# Patient Record
Sex: Male | Born: 1975 | Race: Black or African American | Hispanic: No | Marital: Single | Smoking: Current every day smoker
Health system: Southern US, Community
[De-identification: ages and names within clinical notes are randomized; demographics above are authoritative.]

## PROBLEM LIST (undated history)

## (undated) HISTORY — PX: KNEE SURGERY: SHX244

---

## 2019-05-17 ENCOUNTER — Emergency Department (HOSPITAL_BASED_OUTPATIENT_CLINIC_OR_DEPARTMENT_OTHER)
Admission: EM | Admit: 2019-05-17 | Discharge: 2019-05-17 | Disposition: A | Discharge status: Home / Self Care | Payer: Self-pay | Attending: Emergency Medicine | Admitting: Emergency Medicine

## 2019-05-17 ENCOUNTER — Other Ambulatory Visit: Payer: Self-pay

## 2019-05-17 ENCOUNTER — Emergency Department (HOSPITAL_BASED_OUTPATIENT_CLINIC_OR_DEPARTMENT_OTHER): Payer: Self-pay

## 2019-05-17 ENCOUNTER — Encounter (HOSPITAL_BASED_OUTPATIENT_CLINIC_OR_DEPARTMENT_OTHER): Payer: Self-pay | Admitting: *Deleted

## 2019-05-17 DIAGNOSIS — F17210 Nicotine dependence, cigarettes, uncomplicated: Secondary | ICD-10-CM | POA: Insufficient documentation

## 2019-05-17 DIAGNOSIS — M25562 Pain in left knee: Secondary | ICD-10-CM | POA: Insufficient documentation

## 2019-05-17 MED ORDER — ACETAMINOPHEN 500 MG PO TABS
1000.0000 mg | ORAL_TABLET | Freq: Once | ORAL | Status: AC
  Administered 2019-05-17: 14:00:00 1000 mg via ORAL
  Filled 2019-05-17: qty 2

## 2019-05-17 NOTE — ED Notes (Signed)
Pt refused knee immobilizer and crutches.  He states he has some at home.

## 2019-05-17 NOTE — ED Triage Notes (Signed)
He stepped into a hole. Injury to his left knee. Swelling, pain and abrasion to his left knee. He is ambulatory.

## 2019-05-17 NOTE — ED Notes (Signed)
Per Ezzard Flax RN. Patient said that he had an immobilizer and crutches at home.

## 2019-05-17 NOTE — ED Provider Notes (Signed)
[facility] EMERGENCY DEPARTMENT Provider Note   CSN: [id number] Arrival date & time: 05/17/19  1235     History   Chief Complaint Chief Complaint  Patient presents with  . Knee Injury    HPI Ray Terrell is a 43 y.o. male who presents for evaluation of left knee pain after mechanical fall.  He reports that about 11 AM this morning, he was helping to move a mattress and states that he fell in a hole that went up to his knee.  He states that his knee scraped the side of all.  He does not think it twisted at all.  He states initially after incident, he was having difficulty ambulating bearing weight.  Since then, he has been able to take a few steps in the knee.  He denies any numbness/weakness.  Denies any hip pain, lower leg or ankle pain.  He has not taken anything for pain.     The history is provided by the patient.    History reviewed. No pertinent past medical history.  There are no active problems to display for this patient.   Past Surgical History:  Procedure Laterality Date  . KNEE SURGERY          Home Medications    Prior to Admission medications   Not on File    Family History No family history on file.  Social History Social History   Tobacco Use  . Smoking status: Current Every Day Smoker  . Smokeless tobacco: Never Used  Substance Use Topics  . Alcohol use: Not Currently  . Drug use: Never     Allergies   Patient has no known allergies.   Review of Systems Review of Systems  Musculoskeletal:       Left knee pain  Neurological: Negative for weakness and numbness.  All other systems reviewed and are negative.    Physical Exam Updated Vital Signs BP 119/68   Pulse 60   Temp 98.3 F (36.8 C) (Oral)   Resp 20   Ht 5\' 11"  (1.803 m)   Wt 81.6 kg   SpO2 100%   BMI 25.10 kg/m   Physical Exam Vitals signs and nursing note reviewed.  Constitutional:      Appearance: He is well-developed.  HENT:     Head:  Normocephalic and atraumatic.  Eyes:     General: No scleral icterus.       Right eye: No discharge.        Left eye: No discharge.     Conjunctiva/sclera: Conjunctivae normal.  Cardiovascular:     Pulses:          Dorsalis pedis pulses are 2+ on the left side.  Pulmonary:     Effort: Pulmonary effort is normal.  Musculoskeletal:     Comments: Tenderness palpation noted medial aspect of left knee.  There is some mild overlying soft tissue swelling.  No deformity or crepitus noted.  No ecchymosis.  Flexion/extension intact but with some subjective reports of pain.  Negative posterior and anterior drawer test.  Pain with varus and valgus stress but no instability noted.  He has a small abrasion noted on the anterior medial aspect of his left knee.  No laceration.  No tenderness palpation noted to femur, hip.  No tenderness palpation noted proximal, distal tib-fib, ankle.  He can wiggle all 5 toes without any difficulty.  Skin:    General: Skin is warm and dry.     Capillary Refill:  Capillary refill takes less than 2 seconds.     Comments: Good distal cap refill.  RLE is not dusky in appearance or cool to touch.  Neurological:     Mental Status: He is alert.     Comments: Sensation intact along major nerve distributions of BLE  Psychiatric:        Speech: Speech normal.        Behavior: Behavior normal.      ED Treatments / Results  Labs (all labs ordered are listed, but only abnormal results are displayed) Labs Reviewed - No data to display  EKG None  Radiology Dg Knee Complete 4 Views Left  Result Date: 05/17/2019 CLINICAL DATA:  Injury. EXAM: LEFT KNEE - COMPLETE 4+ VIEW COMPARISON:  No recent. FINDINGS: Severe tricompartment degenerative change noted. Multiple loose bodies. No fracture or dislocation knee joint effusion cannot be excluded. IMPRESSION: 1. 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 No acute bony abnormality identified. 2.  Knee joint  effusion cannot be excluded. Electronically Signed   By: Marcello Moores  Register   On: 05/17/2019 13:08    Procedures Procedures (including critical care time)  Medications Ordered in ED Medications  acetaminophen (TYLENOL) tablet 1,000 mg (1,000 mg Oral Given 05/17/19 1358)     Initial Impression / Assessment and Plan / ED Course  I have reviewed the triage vital signs and the nursing notes.  Pertinent labs & imaging results that were available during my care of the patient were reviewed by me and considered in my medical decision making (see chart for details).        43 year old male who presents for evaluation of left knee pain after mechanical fall that occurred about 11 AM this morning.  Reports falling at home.  Has been able to take a few steps and ambulate. Patient is afebrile, non-toxic appearing, sitting comfortably on examination table. Vital signs reviewed and stable. Patient is neurovascularly intact.  On exam, he has tenderness palpation of the medial anterior aspect of left knee with some overlying soft tissue swelling.  No deformity or crepitus noted.  Concern for knee sprain versus fracture versus dislocation.  X-rays ordered at triage.  X-rays reviewed.  There is no acute fracture or dislocation noted.  Knee joint effusion cannot be excluded.  He also has severe tricompartment degenerative changes with multiple loose bodies.  He does have some mild swelling noted on the medial aspect.  I suspect this is most likely from traumatic injury.  No indication for arthrocentesis at this time.  He is exam not comfortable concerning for septic arthritis.  We will plan to treat as knee sprain with knee immobilizer and crutches.  Discussed plan with patient.  We will given outpatient orthopedic follow-up. At this time, patient exhibits no emergent life-threatening condition that require further evaluation in ED or admission. Patient had ample opportunity for questions and discussion. All  patient's questions were answered with full understanding. Strict return precautions discussed. Patient expresses understanding and agreement to plan.   Portions of this note were generated with [employer]. Dictation errors may occur despite best attempts at proofreading.  Final Clinical Impressions(s) / ED Diagnoses   Final diagnoses:  Acute pain of left knee    ED Discharge Orders    None       Desma Mcgregor 05/17/19 2129    Sherwood Gambler, MD 05/18/19 [phone]

## 2019-05-17 NOTE — Discharge Instructions (Signed)
You can take Tylenol or Ibuprofen as directed for pain. You can alternate Tylenol and Ibuprofen every 4 hours. If you take Tylenol at 1pm, then you can take Ibuprofen at 5pm. Then you can take Tylenol again at 9pm.   Follow the RICE (Rest, Ice, Compression, Elevation) protocol as directed.   Wear knee immobilizer for support and stabilization.  Follow-up with referred outpatient orthopedic doctor.  Return to emergency department for any worsening pain, numbness/weakness of your arms or legs or any other worsening or concerning symptoms.

## 2019-08-06 ENCOUNTER — Other Ambulatory Visit: Payer: Self-pay

## 2019-08-06 ENCOUNTER — Encounter (HOSPITAL_BASED_OUTPATIENT_CLINIC_OR_DEPARTMENT_OTHER): Payer: Self-pay | Admitting: *Deleted

## 2019-08-06 ENCOUNTER — Emergency Department (HOSPITAL_BASED_OUTPATIENT_CLINIC_OR_DEPARTMENT_OTHER)
Admission: EM | Admit: 2019-08-06 | Discharge: 2019-08-06 | Disposition: A | Discharge status: Home / Self Care | Payer: Self-pay | Attending: Emergency Medicine | Admitting: Emergency Medicine

## 2019-08-06 DIAGNOSIS — Y998 Other external cause status: Secondary | ICD-10-CM | POA: Insufficient documentation

## 2019-08-06 DIAGNOSIS — F17210 Nicotine dependence, cigarettes, uncomplicated: Secondary | ICD-10-CM | POA: Insufficient documentation

## 2019-08-06 DIAGNOSIS — L739 Follicular disorder, unspecified: Secondary | ICD-10-CM | POA: Insufficient documentation

## 2019-08-06 DIAGNOSIS — Y9289 Other specified places as the place of occurrence of the external cause: Secondary | ICD-10-CM | POA: Insufficient documentation

## 2019-08-06 DIAGNOSIS — Y9389 Activity, other specified: Secondary | ICD-10-CM | POA: Insufficient documentation

## 2019-08-06 DIAGNOSIS — S30862A Insect bite (nonvenomous) of penis, initial encounter: Secondary | ICD-10-CM | POA: Insufficient documentation

## 2019-08-06 DIAGNOSIS — W57XXXA Bitten or stung by nonvenomous insect and other nonvenomous arthropods, initial encounter: Secondary | ICD-10-CM | POA: Insufficient documentation

## 2019-08-06 MED ORDER — SULFAMETHOXAZOLE-TRIMETHOPRIM 800-160 MG PO TABS
1.0000 | ORAL_TABLET | Freq: Once | ORAL | Status: AC
  Administered 2019-08-06: 1 via ORAL
  Filled 2019-08-06: qty 1

## 2019-08-06 MED ORDER — SULFAMETHOXAZOLE-TRIMETHOPRIM 800-160 MG PO TABS: 1.0000 | ORAL_TABLET | Freq: Two times a day (BID) | ORAL | 0 refills | Status: DC

## 2019-08-06 MED FILL — SULFAMETHOXAZOLE-TMP DS TAB: 800-160 | 7 days supply | Qty: 14 | Fill #0

## 2019-08-06 NOTE — ED Triage Notes (Signed)
Spider bite to his penis x 3 days.

## 2019-08-06 NOTE — ED Provider Notes (Signed)
[place] EMERGENCY DEPARTMENT Provider Note   CSN: [id number] Arrival date & time: 08/06/19  1047     History   Chief Complaint Chief Complaint  Patient presents with  . Insect Bite    HPI Ray Terrell is a 43 y.o. male presenting with concern for insect bite to his penis that he noticed 3 days ago.  He has had spider bites in the past and thinks this may be another one, however did not see a particular spider bite him.  Patient states initially began as a raised red bump which he then "messed with."  He states it then opened up and drained some fluid.  It is painful and irritating.  He is currently sexually active with male partners without protection.  He denies history of STD or urinary symptoms.  No fevers.  No interventions tried prior to arrival.      The history is provided by the patient.    History reviewed. No pertinent past medical history.  There are no active problems to display for this patient.   Past Surgical History:  Procedure Laterality Date  . KNEE SURGERY          Home Medications    Prior to Admission medications   Medication Sig Start Date End Date Taking? Authorizing Provider  sulfamethoxazole-trimethoprim (BACTRIM DS) 800-160 MG tablet Take 1 tablet by mouth 2 (two) times daily for 7 days. 08/06/19 08/13/19  Isla Pence, MD    Family History No family history on file.  Social History Social History   Tobacco Use  . Smoking status: Current Every Day Smoker  . Smokeless tobacco: Never Used  Substance Use Topics  . Alcohol use: Not Currently  . Drug use: Never     Allergies   Patient has no known allergies.   Review of Systems Review of Systems  All other systems reviewed and are negative.    Physical Exam Updated Vital Signs BP 132/87   Pulse 60   Temp 98.2 F (36.8 C) (Oral)   Resp 18   Ht 5\' 11"  (1.803 m)   Wt 81.6 kg   SpO2 100%   BMI 25.09 kg/m   Physical Exam Vitals signs and nursing  note reviewed. Exam conducted with a chaperone present.  Constitutional:      Appearance: He is well-developed.  HENT:     Head: Normocephalic and atraumatic.  Eyes:     Conjunctiva/sclera: Conjunctivae normal.  Cardiovascular:     Rate and Rhythm: Normal rate.  Pulmonary:     Effort: Pulmonary effort is normal.  Genitourinary:    Penis: Circumcised.      Comments: There is a firm tender papule with a scabbed center to the right penile shaft.  No crusting or drainage.  No redness.  Patient did not allow me to inspect the remainder of the genitalia. Neurological:     Mental Status: He is alert.  Psychiatric:        Mood and Affect: Mood normal.        Behavior: Behavior normal.      ED Treatments / Results  Labs (all labs ordered are listed, but only abnormal results are displayed) Labs Reviewed - No data to display  EKG None  Radiology No results found.  Procedures Procedures (including critical care time)  Medications Ordered in ED Medications  sulfamethoxazole-trimethoprim (BACTRIM DS) 800-160 MG per tablet 1 tablet (has no administration in time range)     Initial Impression / Assessment  and Plan / ED Course  I have reviewed the triage vital signs and the nursing notes.  Pertinent labs & imaging results that were available during my care of the patient were reviewed by me and considered in my medical decision making (see chart for details).        Patient presenting with rash to his penis that occurred 3 days ago.  He does report unprotected sexual activity with male partners.  On exam there is a tender raised lesion to the penile shaft, not actively draining.  Exam seems less consistent with herpetic lesion or chancre.  Discussed this with patient to provide reassurance, however patient became angry with this provider that I even mentioned an STD.  Assured patient this was purely for reassurance, however was unable to discuss management recommendations for  likely folliculitis, as pt demanded to see another provider. Dr. Particia Nearing evaluated pt and discussed management recommendations. Pt discharged.  Final Clinical Impressions(s) / ED Diagnoses   Final diagnoses:  Insect bite of penis, initial encounter  Folliculitis    ED Discharge Orders         Ordered    sulfamethoxazole-trimethoprim (BACTRIM DS) 800-160 MG tablet  2 times daily     08/06/19 1136           [place], [place], [place] 08/06/19 1141    Jacalyn Lefevre, MD 08/06/19 1409

## 2019-08-10 ENCOUNTER — Encounter (HOSPITAL_BASED_OUTPATIENT_CLINIC_OR_DEPARTMENT_OTHER): Payer: Self-pay | Admitting: Emergency Medicine

## 2019-08-10 ENCOUNTER — Other Ambulatory Visit: Payer: Self-pay

## 2019-08-10 ENCOUNTER — Emergency Department (HOSPITAL_BASED_OUTPATIENT_CLINIC_OR_DEPARTMENT_OTHER)
Admission: EM | Admit: 2019-08-10 | Discharge: 2019-08-10 | Disposition: A | Discharge status: Home / Self Care | Payer: PRIVATE HEALTH INSURANCE | Attending: Emergency Medicine | Admitting: Emergency Medicine

## 2019-08-10 DIAGNOSIS — F17210 Nicotine dependence, cigarettes, uncomplicated: Secondary | ICD-10-CM | POA: Insufficient documentation

## 2019-08-10 DIAGNOSIS — Z202 Contact with and (suspected) exposure to infections with a predominantly sexual mode of transmission: Secondary | ICD-10-CM

## 2019-08-10 LAB — URINALYSIS, ROUTINE W REFLEX MICROSCOPIC
Bilirubin Urine: NEGATIVE
Glucose, UA: NEGATIVE mg/dL
Ketones, ur: NEGATIVE mg/dL
Nitrite: NEGATIVE
Protein, ur: NEGATIVE mg/dL
Specific Gravity, Urine: 1.025 (ref 1.005–1.030)
pH: 6 (ref 5.0–8.0)

## 2019-08-10 LAB — URINALYSIS, MICROSCOPIC (REFLEX)

## 2019-08-10 MED ORDER — CEFTRIAXONE SODIUM 250 MG IJ SOLR
250.0000 mg | Freq: Once | INTRAMUSCULAR | Status: AC
  Administered 2019-08-10: 18:00:00 250 mg via INTRAMUSCULAR
  Filled 2019-08-10: qty 250

## 2019-08-10 MED ORDER — LIDOCAINE HCL (PF) 1 % IJ SOLN
INTRAMUSCULAR | Status: AC
  Administered 2019-08-10: 18:00:00
  Filled 2019-08-10: qty 5

## 2019-08-10 MED ORDER — AZITHROMYCIN 250 MG PO TABS
1000.0000 mg | ORAL_TABLET | Freq: Once | ORAL | Status: AC
  Administered 2019-08-10: 18:00:00 1000 mg via ORAL
  Filled 2019-08-10: qty 4

## 2019-08-10 MED ORDER — METRONIDAZOLE 500 MG PO TABS
2000.0000 mg | ORAL_TABLET | Freq: Once | ORAL | Status: AC
  Administered 2019-08-10: 2000 mg via ORAL
  Filled 2019-08-10: qty 4

## 2019-08-10 NOTE — Discharge Instructions (Signed)
You were evaluated in the emergency department for possible STDs.  You were tested for HIV syphilis gonorrhea chlamydia and trichomonas.  We will contact you if any of your tests are positive.  Please use condoms for safer sex.

## 2019-08-10 NOTE — ED Triage Notes (Signed)
Sexual partner tested positive for Trich.  Pt wants to be tested for STDs. No sx.

## 2019-08-10 NOTE — ED Provider Notes (Signed)
[place] EMERGENCY DEPARTMENT Provider Note   CSN: [id number] Arrival date & time: 08/10/19  1736     History   Chief Complaint Chief Complaint  Patient presents with  . Exposure to STD    HPI Ray Terrell is a 43 y.o. male.  He has no significant past medical history.  He is complaining of his sexual partner being positive for trichomonas.  He denies any symptoms.  He is here asking for testing for STDs and treatment.  No known sores or lesions no discharge.     The history is provided by the patient.  Exposure to STD This is a new problem. The problem has not changed since onset.Pertinent negatives include no abdominal pain and no headaches. Nothing aggravates the symptoms. Nothing relieves the symptoms. He has tried nothing for the symptoms. The treatment provided no relief.    History reviewed. No pertinent past medical history.  There are no active problems to display for this patient.   Past Surgical History:  Procedure Laterality Date  . KNEE SURGERY          Home Medications    Prior to Admission medications   Not on File    Family History No family history on file.  Social History Social History   Tobacco Use  . Smoking status: Current Every Day Smoker    Packs/day: 0.50    Types: Cigarettes  . Smokeless tobacco: Never Used  Substance Use Topics  . Alcohol use: Not Currently  . Drug use: Never     Allergies   Patient has no known allergies.   Review of Systems Review of Systems  Constitutional: Negative for fever.  Gastrointestinal: Negative for abdominal pain.  Genitourinary: Negative for discharge, dysuria, hematuria and testicular pain.  Skin: Negative for rash.  Neurological: Negative for headaches.     Physical Exam Updated Vital Signs BP 139/86 (BP Location: Left Arm)   Pulse 96   Temp 99.2 F (37.3 C) (Oral)   Resp 16   Ht 5\' 11"  (1.803 m)   Wt 78 kg   SpO2 99%   BMI 23.99 kg/m   Physical Exam  Vitals signs and nursing note reviewed.  Constitutional:      Appearance: He is well-developed.  HENT:     Head: Normocephalic and atraumatic.  Eyes:     Conjunctiva/sclera: Conjunctivae normal.  Neck:     Musculoskeletal: Neck supple.  Cardiovascular:     Rate and Rhythm: Normal rate and regular rhythm.  Pulmonary:     Effort: Pulmonary effort is normal.     Breath sounds: Normal breath sounds.  Abdominal:     Tenderness: There is no abdominal tenderness. There is no guarding.  Skin:    General: Skin is warm and dry.     Capillary Refill: Capillary refill takes less than 2 seconds.  Neurological:     General: No focal deficit present.     Mental Status: He is alert.     GCS: GCS eye subscore is 4. GCS verbal subscore is 5. GCS motor subscore is 6.     Gait: Gait normal.      ED Treatments / Results  Labs (all labs ordered are listed, but only abnormal results are displayed) Labs Reviewed  URINALYSIS, ROUTINE W REFLEX MICROSCOPIC - Abnormal; Notable for the following components:      Result Value   Hgb urine dipstick TRACE (*)    Leukocytes,Ua TRACE (*)    All other  components within normal limits  URINALYSIS, MICROSCOPIC (REFLEX) - Abnormal; Notable for the following components:   Bacteria, UA RARE (*)    All other components within normal limits  RPR  HIV ANTIBODY (ROUTINE TESTING W REFLEX)  GC/CHLAMYDIA PROBE AMP ([facility]) NOT AT [facility]    EKG None  Radiology No results found.  Procedures Procedures (including critical care time)  Medications Ordered in ED Medications  cefTRIAXone (ROCEPHIN) injection 250 mg (has no administration in time range)  azithromycin (ZITHROMAX) tablet 1,000 mg (has no administration in time range)  metroNIDAZOLE (FLAGYL) tablet 2,000 mg (has no administration in time range)     Initial Impression / Assessment and Plan / ED Course  I have reviewed the triage vital signs and the nursing notes.  Pertinent labs & imaging  results that were available during my care of the patient were reviewed by me and considered in my medical decision making (see chart for details).  Clinical Course as of Aug 10 1939  Tue Aug 10, 2019  1759 Patient here for STD testing after his partner tested positive.  He is agreeable to blood testing and urine testing along with treatment for gonorrhea chlamydia and trichomonas.   [MB]    Clinical Course User Index [MB] Terrilee Files, MD        Final Clinical Impressions(s) / ED Diagnoses   Final diagnoses:  STD exposure    ED Discharge Orders    None       Terrilee Files, MD 08/10/19 1940

## 2019-08-10 NOTE — ED Notes (Signed)
Pt. Reports his girlfriend gave him an STD possibly.

## 2019-08-11 LAB — HIV ANTIBODY (ROUTINE TESTING W REFLEX): HIV Screen 4th Generation wRfx: NONREACTIVE

## 2019-08-11 LAB — RPR: RPR Ser Ql: NONREACTIVE

## 2020-04-03 ENCOUNTER — Emergency Department (HOSPITAL_BASED_OUTPATIENT_CLINIC_OR_DEPARTMENT_OTHER)
Admission: EM | Admit: 2020-04-03 | Discharge: 2020-04-03 | Disposition: A | Discharge status: Home / Self Care | Payer: HRSA Program | Attending: Emergency Medicine | Admitting: Emergency Medicine

## 2020-04-03 ENCOUNTER — Other Ambulatory Visit: Payer: Self-pay

## 2020-04-03 ENCOUNTER — Encounter (HOSPITAL_BASED_OUTPATIENT_CLINIC_OR_DEPARTMENT_OTHER): Payer: Self-pay | Admitting: *Deleted

## 2020-04-03 DIAGNOSIS — J3489 Other specified disorders of nose and nasal sinuses: Secondary | ICD-10-CM | POA: Diagnosis present

## 2020-04-03 DIAGNOSIS — U071 COVID-19: Secondary | ICD-10-CM | POA: Insufficient documentation

## 2020-04-03 DIAGNOSIS — F17210 Nicotine dependence, cigarettes, uncomplicated: Secondary | ICD-10-CM | POA: Insufficient documentation

## 2020-04-03 DIAGNOSIS — Z20822 Contact with and (suspected) exposure to covid-19: Secondary | ICD-10-CM

## 2020-04-03 NOTE — ED Triage Notes (Signed)
Runny nose, loss of smell since this am.

## 2020-04-03 NOTE — ED Provider Notes (Signed)
[facility] EMERGENCY DEPARTMENT Provider Note   CSN: [id number] Arrival date & time: 04/03/20  1836     History No chief complaint on file.   Ray Terrell is a 44 y.o. male who presenting with loss of smell this morning.  Patient states that he may have a Covid exposure but he is not sure .  Denies any chest pain or shortness of breath or fevers.  Patient did not receive the Covid vaccine yet.  Denies any lung problems.  The history is provided by the patient.       History reviewed. No pertinent past medical history.  There are no problems to display for this patient.   Past Surgical History:  Procedure Laterality Date  . KNEE SURGERY         No family history on file.  Social History   Tobacco Use  . Smoking status: Current Every Day Smoker    Packs/day: 0.50    Types: Cigarettes  . Smokeless tobacco: Never Used  Substance Use Topics  . Alcohol use: Not Currently  . Drug use: Never    Home Medications Prior to Admission medications   Not on File    Allergies    Patient has no known allergies.  Review of Systems   Review of Systems  HENT: Positive for sinus pressure.   All other systems reviewed and are negative.   Physical Exam Updated Vital Signs BP 130/77 (BP Location: Right Arm)   Pulse (!) 54   Temp 98.8 F (37.1 C) (Oral)   Resp 18   SpO2 100%   Physical Exam Vitals and nursing note reviewed.  Constitutional:      Appearance: Normal appearance.  HENT:     Head: Normocephalic.     Nose: Nose normal.     Mouth/Throat:     Mouth: Mucous membranes are moist.  Eyes:     Extraocular Movements: Extraocular movements intact.     Pupils: Pupils are equal, round, and reactive to light.  Cardiovascular:     Rate and Rhythm: Normal rate and regular rhythm.     Pulses: Normal pulses.     Heart sounds: Normal heart sounds.  Pulmonary:     Effort: Pulmonary effort is normal.     Breath sounds: Normal breath sounds.    Abdominal:     General: Abdomen is flat.     Palpations: Abdomen is soft.  Musculoskeletal:        General: Normal range of motion.     Cervical back: Normal range of motion.  Skin:    General: Skin is warm.     Capillary Refill: Capillary refill takes less than 2 seconds.  Neurological:     General: No focal deficit present.     Mental Status: He is alert.  Psychiatric:        Mood and Affect: Mood normal.        Behavior: Behavior normal.     ED Results / Procedures / Treatments   Labs (all labs ordered are listed, but only abnormal results are displayed) Labs Reviewed - No data to display  EKG None  Radiology No results found.  Procedures Procedures (including critical care time)  Medications Ordered in ED Medications - No data to display  ED Course  I have reviewed the triage vital signs and the nursing notes.  Pertinent labs & imaging results that were available during my care of the patient were reviewed by me and considered in  my medical decision making (see chart for details).    MDM Rules/Calculators/A&P                          Ray Terrell is a 44 y.o. male here with loss of smell.  Patient has a potential Covid exposure.  His oxygen level is normal.  He is not vaccinated.  Will swab for Covid and told him to stay home until results come back.  Gave quarantine guidelines.  Ray Terrell was evaluated in Emergency Department on 04/03/2020 for the symptoms described in the history of present illness. He was evaluated in the context of the global COVID-19 pandemic, which necessitated consideration that the patient might be at risk for infection with the SARS-CoV-2 virus that causes COVID-19. Institutional protocols and algorithms that pertain to the evaluation of patients at risk for COVID-19 are in a state of rapid change based on information released by regulatory bodies including the CDC and federal and state organizations. These policies and  algorithms were followed during the patient's care in the ED.   Final Clinical Impression(s) / ED Diagnoses Final diagnoses:  None    Rx / DC Orders ED Discharge Orders    None       Charlynne Pander, MD 04/03/20 2215

## 2020-04-03 NOTE — Discharge Instructions (Signed)
You were tested for COVID.  Stay home until result comes back.  If you have Covid see guidelines below.  Follow-up with your primary care doctor.  Try and get a pulse ox if you have Covid and return if the pulse ox showed oxygen level less than 90.  Return to ER if you have worse shortness of breath, fever, cough.     Person Under Monitoring Name: Ray Terrell  Location: [address] [place] [address]   Infection Prevention Recommendations for Individuals Confirmed to have, or Being Evaluated for, 2019 Novel Coronavirus (COVID-19) Infection Who Receive Care at Home  Individuals who are confirmed to have, or are being evaluated for, COVID-19 should follow the prevention steps below until a healthcare provider or local or state health department says they can return to normal activities.  Stay home except to get medical care You should restrict activities outside your home, except for getting medical care. Do not go to work, school, or public areas, and do not use public transportation or taxis.  Call ahead before visiting your doctor Before your medical appointment, call the healthcare provider and tell them that you have, or are being evaluated for, COVID-19 infection. This will help the healthcare provider's office take steps to keep other people from getting infected. Ask your healthcare provider to call the local or state health department.  Monitor your symptoms Seek prompt medical attention if your illness is worsening (e.g., difficulty breathing). Before going to your medical appointment, call the healthcare provider and tell them that you have, or are being evaluated for, COVID-19 infection. Ask your healthcare provider to call the local or state health department.  Wear a facemask You should wear a facemask that covers your nose and mouth when you are in the same room with other people and when you visit a healthcare provider. People who live with or  visit you should also wear a facemask while they are in the same room with you.  Separate yourself from other people in your home As much as possible, you should stay in a different room from other people in your home. Also, you should use a separate bathroom, if available.  Avoid sharing household items You should not share dishes, drinking glasses, cups, eating utensils, towels, bedding, or other items with other people in your home. After using these items, you should wash them thoroughly with soap and water.  Cover your coughs and sneezes Cover your mouth and nose with a tissue when you cough or sneeze, or you can cough or sneeze into your sleeve. Throw used tissues in a lined trash can, and immediately wash your hands with soap and water for at least 20 seconds or use an alcohol-based hand rub.  Wash your [employer] your hands often and thoroughly with soap and water for at least 20 seconds. You can use an alcohol-based hand sanitizer if soap and water are not available and if your hands are not visibly dirty. Avoid touching your eyes, nose, and mouth with unwashed hands.   Prevention Steps for Caregivers and Household Members of Individuals Confirmed to have, or Being Evaluated for, COVID-19 Infection Being Cared for in the Home  If you live with, or provide care at home for, a person confirmed to have, or being evaluated for, COVID-19 infection please follow these guidelines to prevent infection:  Follow healthcare provider's instructions Make sure that you understand and can help the patient follow any healthcare provider instructions for all care.  Provide for the patient's basic needs You should help the patient with basic needs in the home and provide support for getting groceries, prescriptions, and other personal needs.  Monitor the patient's symptoms If they are getting sicker, call his or her medical provider and tell them that the patient has, or is being evaluated  for, COVID-19 infection. This will help the healthcare provider's office take steps to keep other people from getting infected. Ask the healthcare provider to call the local or state health department.  Limit the number of people who have contact with the patient If possible, have only one caregiver for the patient. Other household members should stay in another home or place of residence. If this is not possible, they should stay in another room, or be separated from the patient as much as possible. Use a separate bathroom, if available. Restrict visitors who do not have an essential need to be in the home.  Keep older adults, very young children, and other sick people away from the patient Keep older adults, very young children, and those who have compromised immune systems or chronic health conditions away from the patient. This includes people with chronic heart, lung, or kidney conditions, diabetes, and cancer.  Ensure good ventilation Make sure that shared spaces in the home have good air flow, such as from an air conditioner or an opened window, weather permitting.  Wash your hands often Wash your hands often and thoroughly with soap and water for at least 20 seconds. You can use an alcohol based hand sanitizer if soap and water are not available and if your hands are not visibly dirty. Avoid touching your eyes, nose, and mouth with unwashed hands. Use disposable paper towels to dry your hands. If not available, use dedicated cloth towels and replace them when they become wet.  Wear a facemask and gloves Wear a disposable facemask at all times in the room and gloves when you touch or have contact with the patient's blood, body fluids, and/or secretions or excretions, such as sweat, saliva, sputum, nasal mucus, vomit, urine, or feces.  Ensure the mask fits over your nose and mouth tightly, and do not touch it during use. Throw out disposable facemasks and gloves after using them. Do not  reuse. Wash your hands immediately after removing your facemask and gloves. If your personal clothing becomes contaminated, carefully remove clothing and launder. Wash your hands after handling contaminated clothing. Place all used disposable facemasks, gloves, and other waste in a lined container before disposing them with other household waste. Remove gloves and wash your hands immediately after handling these items.  Do not share dishes, glasses, or other household items with the patient Avoid sharing household items. You should not share dishes, drinking glasses, cups, eating utensils, towels, bedding, or other items with a patient who is confirmed to have, or being evaluated for, COVID-19 infection. After the person uses these items, you should wash them thoroughly with soap and water.  Wash laundry thoroughly Immediately remove and wash clothes or bedding that have blood, body fluids, and/or secretions or excretions, such as sweat, saliva, sputum, nasal mucus, vomit, urine, or feces, on them. Wear gloves when handling laundry from the patient. Read and follow directions on labels of laundry or clothing items and detergent. In general, wash and dry with the warmest temperatures recommended on the label.  Clean all areas the individual has used often Clean all touchable surfaces, such as counters, tabletops, doorknobs, bathroom fixtures, toilets, phones, keyboards, tablets,  and bedside tables, every day. Also, clean any surfaces that may have blood, body fluids, and/or secretions or excretions on them. Wear gloves when cleaning surfaces the patient has come in contact with. Use a diluted bleach solution (e.g., dilute bleach with 1 part bleach and 10 parts water) or a household disinfectant with a label that says EPA-registered for coronaviruses. To make a bleach solution at home, add 1 tablespoon of bleach to 1 quart (4 cups) of water. For a larger supply, add  cup of bleach to 1 gallon (16  cups) of water. Read labels of cleaning products and follow recommendations provided on product labels. Labels contain instructions for safe and effective use of the cleaning product including precautions you should take when applying the product, such as wearing gloves or eye protection and making sure you have good ventilation during use of the product. Remove gloves and wash hands immediately after cleaning.  Monitor yourself for signs and symptoms of illness Caregivers and household members are considered close contacts, should monitor their health, and will be asked to limit movement outside of the home to the extent possible. Follow the monitoring steps for close contacts listed on the symptom monitoring form.   ? If you have additional questions, contact your local health department or call the epidemiologist on call at [phone] (available 24/7). ? This guidance is subject to change. For the most up-to-date guidance from [facility], please refer to their website: [email or web address]

## 2020-04-05 LAB — SARS CORONAVIRUS 2 (TAT 6-24 HRS): SARS Coronavirus 2: POSITIVE — AB

## 2020-04-06 ENCOUNTER — Telehealth: Payer: Self-pay | Admitting: Physician Assistant

## 2020-04-06 NOTE — Telephone Encounter (Signed)
Called to discuss with Ray Terrell about Covid symptoms and the use of bamlanivimab/etesevimab or casirivimab/imdevimab, a monoclonal antibody infusion for those with mild to moderate Covid symptoms and at a high risk of hospitalization.     Pt does not qualify for infusion therapy as pt has asymptomatic infection. Isolation precautions discussed. Advised to contact back for consideration should they develop symptoms. Patient verbalized understanding.      BMI 25  Cline Crock PA-C

## 2021-04-23 ENCOUNTER — Emergency Department (HOSPITAL_BASED_OUTPATIENT_CLINIC_OR_DEPARTMENT_OTHER)
Admission: EM | Admit: 2021-04-23 | Discharge: 2021-04-23 | Discharge status: Against Medical Advice | Payer: Self-pay | Attending: Emergency Medicine | Admitting: Emergency Medicine

## 2021-04-23 ENCOUNTER — Encounter (HOSPITAL_BASED_OUTPATIENT_CLINIC_OR_DEPARTMENT_OTHER): Payer: Self-pay | Admitting: Emergency Medicine

## 2021-04-23 ENCOUNTER — Other Ambulatory Visit: Payer: Self-pay

## 2021-04-23 DIAGNOSIS — K029 Dental caries, unspecified: Secondary | ICD-10-CM | POA: Insufficient documentation

## 2021-04-23 DIAGNOSIS — F17210 Nicotine dependence, cigarettes, uncomplicated: Secondary | ICD-10-CM | POA: Insufficient documentation

## 2021-04-23 MED ORDER — PENICILLIN V POTASSIUM 500 MG PO TABS: 500.0000 mg | ORAL_TABLET | Freq: Four times a day (QID) | ORAL | 0 refills | Status: AC

## 2021-04-23 MED ORDER — DICLOFENAC SODIUM ER 100 MG PO TB24: 100.0000 mg | ORAL_TABLET | Freq: Every day | ORAL | 0 refills | Status: AC

## 2021-04-23 MED ORDER — LIDOCAINE VISCOUS HCL 2 % MT SOLN
15.0000 mL | Freq: Once | OROMUCOSAL | Status: AC
  Administered 2021-04-23: 15 mL via OROMUCOSAL
  Filled 2021-04-23: qty 15

## 2021-04-23 MED ORDER — PENICILLIN V POTASSIUM 250 MG PO TABS
500.0000 mg | ORAL_TABLET | Freq: Once | ORAL | Status: AC
  Administered 2021-04-23: 500 mg via ORAL
  Filled 2021-04-23: qty 2

## 2021-04-23 MED ORDER — CHLORHEXIDINE GLUCONATE 0.12 % MT SOLN: 15.0000 mL | Freq: Two times a day (BID) | OROMUCOSAL | 0 refills | Status: AC

## 2021-04-23 MED ORDER — NAPROXEN 250 MG PO TABS
500.0000 mg | ORAL_TABLET | ORAL | Status: AC
  Administered 2021-04-23: 500 mg via ORAL
  Filled 2021-04-23: qty 2

## 2021-04-23 NOTE — ED Provider Notes (Addendum)
[facility] EMERGENCY DEPARTMENT Provider Note   CSN: [id number] Arrival date & time: 04/23/21  0411     History Chief Complaint  Patient presents with   Dental Pain    Ray Terrell is a 45 y.o. male.  The history is provided by the patient.  Dental Pain Location:  Lower Lower teeth location:  19/LL 1st molar, 18/LL 2nd molar and 20/LL 2nd bicuspid Quality:  Aching Severity:  Severe Onset quality:  Sudden Duration:  4 hours Timing:  Constant Progression:  Unchanged Chronicity:  New Context: crown fracture and dental caries   Previous work-up:  Dental exam Relieved by:  Nothing Worsened by:  Nothing Ineffective treatments:  None tried Associated symptoms: no congestion, no drooling and no fever   Risk factors: no immunosuppression       History reviewed. No pertinent past medical history.  There are no problems to display for this patient.   Past Surgical History:  Procedure Laterality Date   KNEE SURGERY         History reviewed. No pertinent family history.  Social History   Tobacco Use   Smoking status: Every Day    Packs/day: 0.50    Types: Cigarettes   Smokeless tobacco: Never  Substance Use Topics   Alcohol use: Not Currently   Drug use: Never    Home Medications Prior to Admission medications   Medication Sig Start Date End Date Taking? Authorizing Provider  chlorhexidine (PERIDEX) 0.12 % solution Use as directed 15 mLs in the mouth or throat 2 (two) times daily. 04/23/21  Yes Rylen Hou, MD  Diclofenac Sodium CR 100 MG 24 hr tablet Take 1 tablet (100 mg total) by mouth daily. 04/23/21  Yes Neizan Debruhl, MD  penicillin v potassium (VEETID) 500 MG tablet Take 1 tablet (500 mg total) by mouth 4 (four) times daily for 7 days. 04/23/21 04/30/21 Yes Adonus Uselman, MD    Allergies    Patient has no known allergies.  Review of Systems   Review of Systems  Constitutional:  Negative for fever.  HENT:  Positive for dental problem.  Negative for congestion and drooling.   Eyes:  Negative for redness.  Respiratory:  Negative for wheezing.   Cardiovascular:  Negative for leg swelling.  Gastrointestinal:  Negative for vomiting.  Genitourinary:  Negative for difficulty urinating.  Musculoskeletal:  Negative for neck stiffness.  Neurological:  Negative for facial asymmetry.  Psychiatric/Behavioral:  Negative for agitation.   All other systems reviewed and are negative.  Physical Exam Updated Vital Signs BP 125/90 (BP Location: Right Arm)   Pulse 72   Temp 98.5 F (36.9 C) (Oral)   Resp 18   Ht 5\' 11"  (1.803 m)   Wt 78 kg   SpO2 98%   BMI 23.98 kg/m   Physical Exam Vitals and nursing note reviewed.  Constitutional:      General: He is not in acute distress.    Appearance: Normal appearance.  HENT:     Head: Normocephalic and atraumatic.     Nose: Nose normal.     Mouth/Throat:     Comments: Wide spread dental decay 1st RL molar broken at gum line and necrotic  Eyes:     Pupils: Pupils are equal, round, and reactive to light.  Cardiovascular:     Rate and Rhythm: Normal rate and regular rhythm.     Pulses: Normal pulses.     Heart sounds: Normal heart sounds.  Pulmonary:  Effort: Pulmonary effort is normal.     Breath sounds: Normal breath sounds.  Abdominal:     General: Abdomen is flat. Bowel sounds are normal.     Palpations: Abdomen is soft.     Tenderness: There is no abdominal tenderness. There is no right CVA tenderness.  Musculoskeletal:        General: Normal range of motion.  Skin:    General: Skin is warm and dry.     Capillary Refill: Capillary refill takes less than 2 seconds.  Neurological:     General: No focal deficit present.     Mental Status: He is alert and oriented to person, place, and time.  Psychiatric:        Mood and Affect: Mood normal.        Behavior: Behavior normal.    ED Results / Procedures / Treatments   Labs (all labs ordered are listed, but only  abnormal results are displayed) Labs Reviewed - No data to display  EKG None  Radiology No results found.  Procedures Procedures   Medications Ordered in ED Medications  lidocaine (XYLOCAINE) 2 % viscous mouth solution 15 mL (15 mLs Mouth/Throat Given 04/23/21 0439)  naproxen (NAPROSYN) tablet 500 mg (500 mg Oral Given 04/23/21 0439)  penicillin v potassium (VEETID) tablet 500 mg (500 mg Oral Given 04/23/21 0439)    ED Course  I have reviewed the triage vital signs and the nursing notes.  Pertinent labs & imaging results that were available during my care of the patient were reviewed by me and considered in my medical decision making (see chart for details).    Asking for narcotics as this is "too bad for tylenol or alleve" "you guys aren't going to do anything?"  EDP explained the patient had bee given medication already but would need to follow up with dentistry for xrays and further care.  He reported "this is not going do anything."  I suspect the patient is drug seeking as these are clearly not new dental lesions.  Patient reportedly eloped Final Clinical Impression(s) / ED Diagnoses Final diagnoses:  Pain due to dental caries    Rx / DC Orders ED Discharge Orders          Ordered    penicillin v potassium (VEETID) 500 MG tablet  4 times daily        04/23/21 0450    chlorhexidine (PERIDEX) 0.12 % solution  2 times daily        04/23/21 0450    Diclofenac Sodium CR 100 MG 24 hr tablet  Daily        04/23/21 0450             Kishon Garriga, MD 04/23/21 0455    Keaton Beichner, MD 04/23/21 1610

## 2021-04-23 NOTE — ED Notes (Signed)
Patient reports to nursing station stating that if we can't do anything for him he is going to leave. No new orders received. Dr Nicanor Alcon aware. Patient left prior to discharge.

## 2021-04-23 NOTE — ED Triage Notes (Signed)
Patient presents with complaints of right lower dental pain; states the nerve of his tooth is exposed; states woke up with this pain this am.

## 2021-05-15 ENCOUNTER — Encounter (HOSPITAL_BASED_OUTPATIENT_CLINIC_OR_DEPARTMENT_OTHER): Payer: Self-pay

## 2021-05-15 ENCOUNTER — Emergency Department (HOSPITAL_BASED_OUTPATIENT_CLINIC_OR_DEPARTMENT_OTHER)
Admission: EM | Admit: 2021-05-15 | Discharge: 2021-05-15 | Disposition: A | Discharge status: Home / Self Care | Payer: Self-pay | Attending: Emergency Medicine | Admitting: Emergency Medicine

## 2021-05-15 ENCOUNTER — Other Ambulatory Visit: Payer: Self-pay

## 2021-05-15 DIAGNOSIS — N611 Abscess of the breast and nipple: Secondary | ICD-10-CM | POA: Insufficient documentation

## 2021-05-15 DIAGNOSIS — F17210 Nicotine dependence, cigarettes, uncomplicated: Secondary | ICD-10-CM | POA: Insufficient documentation

## 2021-05-15 DIAGNOSIS — N644 Mastodynia: Secondary | ICD-10-CM | POA: Insufficient documentation

## 2021-05-15 MED ORDER — CEPHALEXIN 500 MG PO CAPS: 500.0000 mg | ORAL_CAPSULE | Freq: Four times a day (QID) | ORAL | 0 refills | Status: AC

## 2021-05-15 MED ORDER — CEPHALEXIN 250 MG PO CAPS
500.0000 mg | ORAL_CAPSULE | Freq: Once | ORAL | Status: AC
  Administered 2021-05-15: 500 mg via ORAL
  Filled 2021-05-15: qty 2

## 2021-05-15 NOTE — ED Provider Notes (Signed)
[facility] EMERGENCY DEPARTMENT Provider Note   CSN: [id number] Arrival date & time: 05/15/21  2214     History Chief Complaint  Patient presents with   Abscess    Ray Terrell is a 45 y.o. male.  The history is provided by the patient.  Abscess Ray Terrell is a 45 y.o. male who presents to the Emergency Department complaining of nipple pain.  He presents to the ED complaining of pain to the right nipple that started upon waking at noon today. No prior similar symptoms. No injury to that area. He denies any associated fevers, chest pain, difficulty breathing, nausea, vomiting. He has no known medical problems and takes no medications. He has significant local pain to the area.    History reviewed. No pertinent past medical history.  There are no problems to display for this patient.   Past Surgical History:  Procedure Laterality Date   KNEE SURGERY         No family history on file.  Social History   Tobacco Use   Smoking status: Every Day    Packs/day: 0.50    Types: Cigarettes   Smokeless tobacco: Never  Substance Use Topics   Alcohol use: Not Currently   Drug use: Never    Home Medications Prior to Admission medications   Medication Sig Start Date End Date Taking? Authorizing Provider  cephALEXin (KEFLEX) 500 MG capsule Take 1 capsule (500 mg total) by mouth 4 (four) times daily. 05/15/21  Yes Tilden Fossa, MD  chlorhexidine (PERIDEX) 0.12 % solution Use as directed 15 mLs in the mouth or throat 2 (two) times daily. 04/23/21   Palumbo, April, MD  Diclofenac Sodium CR 100 MG 24 hr tablet Take 1 tablet (100 mg total) by mouth daily. 04/23/21   Palumbo, April, MD    Allergies    Patient has no known allergies.  Review of Systems   Review of Systems  All other systems reviewed and are negative.  Physical Exam Updated Vital Signs BP (!) 120/94 (BP Location: Left Arm)   Pulse 64   Temp 98.1 F (36.7 C) (Oral)   Resp 18   Ht 5\' 11"   (1.803 m)   Wt 75.8 kg   SpO2 100%   BMI 23.29 kg/m   Physical Exam Vitals and nursing note reviewed.  Constitutional:      Appearance: He is well-developed.  HENT:     Head: Normocephalic and atraumatic.  Cardiovascular:     Rate and Rhythm: Normal rate and regular rhythm.  Pulmonary:     Effort: Pulmonary effort is normal. No respiratory distress.  Chest:     Comments: The right areola has edema and tenderness to palpation. It is mobile with focal fluctuance with mild local erythema. Musculoskeletal:        General: No tenderness.  Skin:    General: Skin is warm and dry.  Neurological:     Mental Status: He is alert and oriented to person, place, and time.  Psychiatric:        Behavior: Behavior normal.    ED Results / Procedures / Treatments   Labs (all labs ordered are listed, but only abnormal results are displayed) Labs Reviewed - No data to display  EKG None  Radiology No results found.  Procedures Procedures   Medications Ordered in ED Medications  cephALEXin (KEFLEX) capsule 500 mg (has no administration in time range)    ED Course  I have reviewed the triage vital  signs and the nursing notes.  Pertinent labs & imaging results that were available during my care of the patient were reviewed by me and considered in my medical decision making (see chart for details).    MDM Rules/Calculators/A&P                          patient here for evaluation of pain to the right areola. There is local swelling and mild erythema. Examination and bedside ultrasound are consistent with fluid collection posterior to the areola. Discussed with patient concern for possible abscess versus early malignancy. Discussed follow-up with the breast center for further treatment as well as return precautions. Will start antibiotics.  Final Clinical Impression(s) / ED Diagnoses Final diagnoses:  Breast abscess in male    Rx / DC Orders ED Discharge Orders          Ordered     Ambulatory referral to Breast Clinic        05/15/21 2323    cephALEXin (KEFLEX) 500 MG capsule  4 times daily        05/15/21 2331             Tilden Fossa, MD 05/15/21 [phone]

## 2021-05-15 NOTE — ED Triage Notes (Signed)
Pt presents with abscess to right nipple that he noticed when he woke up this morning; no drainage.

## 2021-07-19 IMAGING — CR LEFT KNEE - COMPLETE 4+ VIEW
4 series · 4 of 4 positions shown · non-contrast
Comparison: No recent.

CLINICAL DATA: Injury.

EXAM:
LEFT KNEE - COMPLETE 4+ VIEW

[t knee ap left]
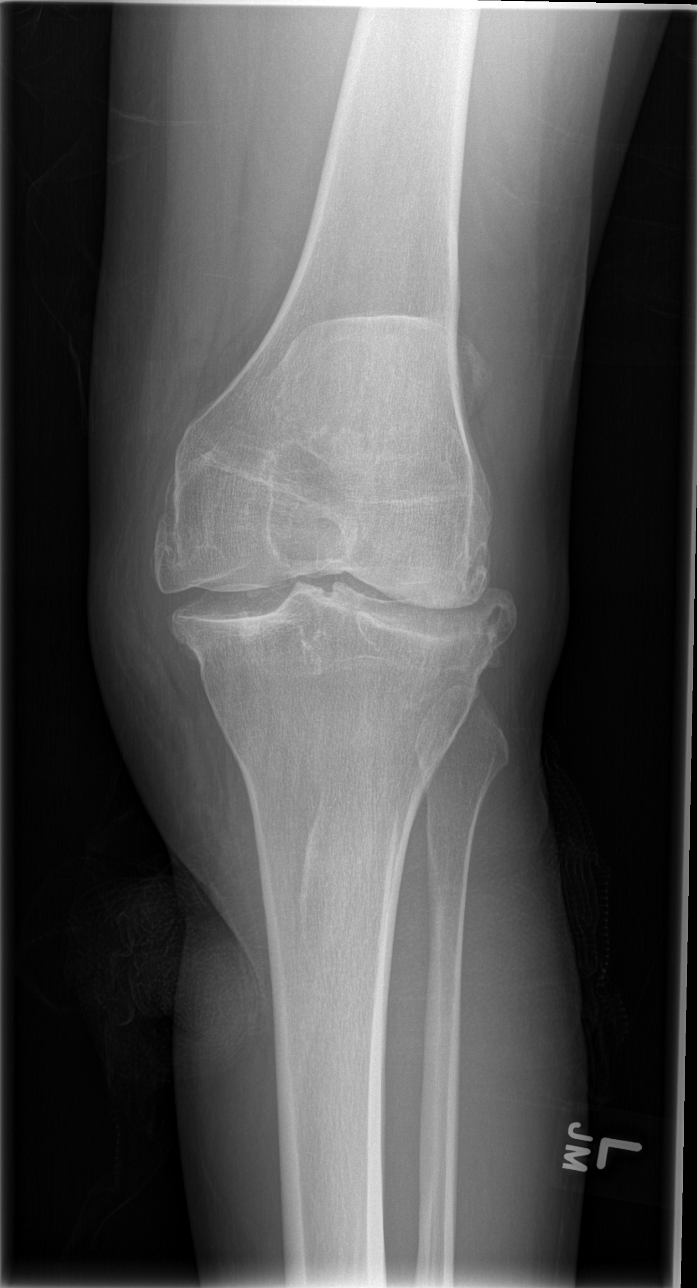

[t knee oblique left (1 of 2)]
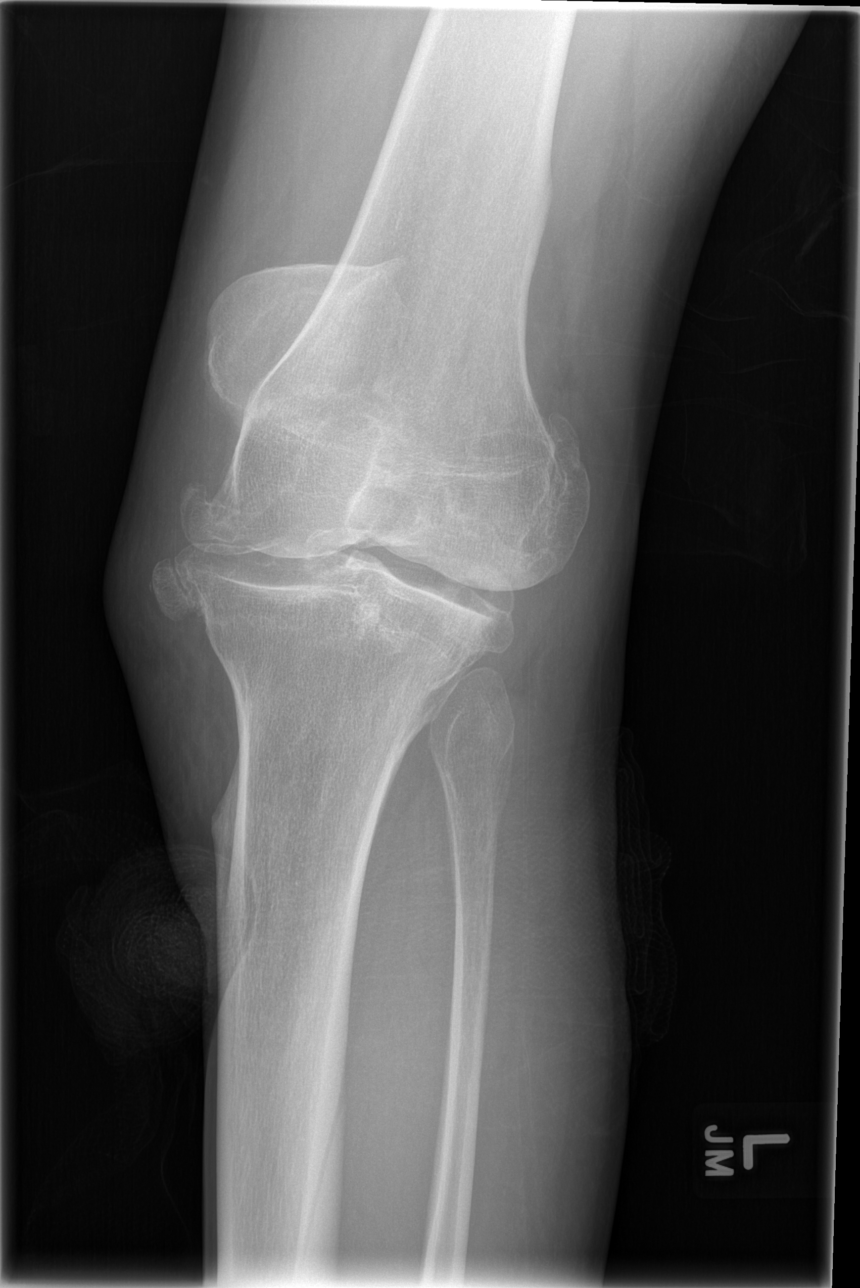

[t knee oblique left (2 of 2)]
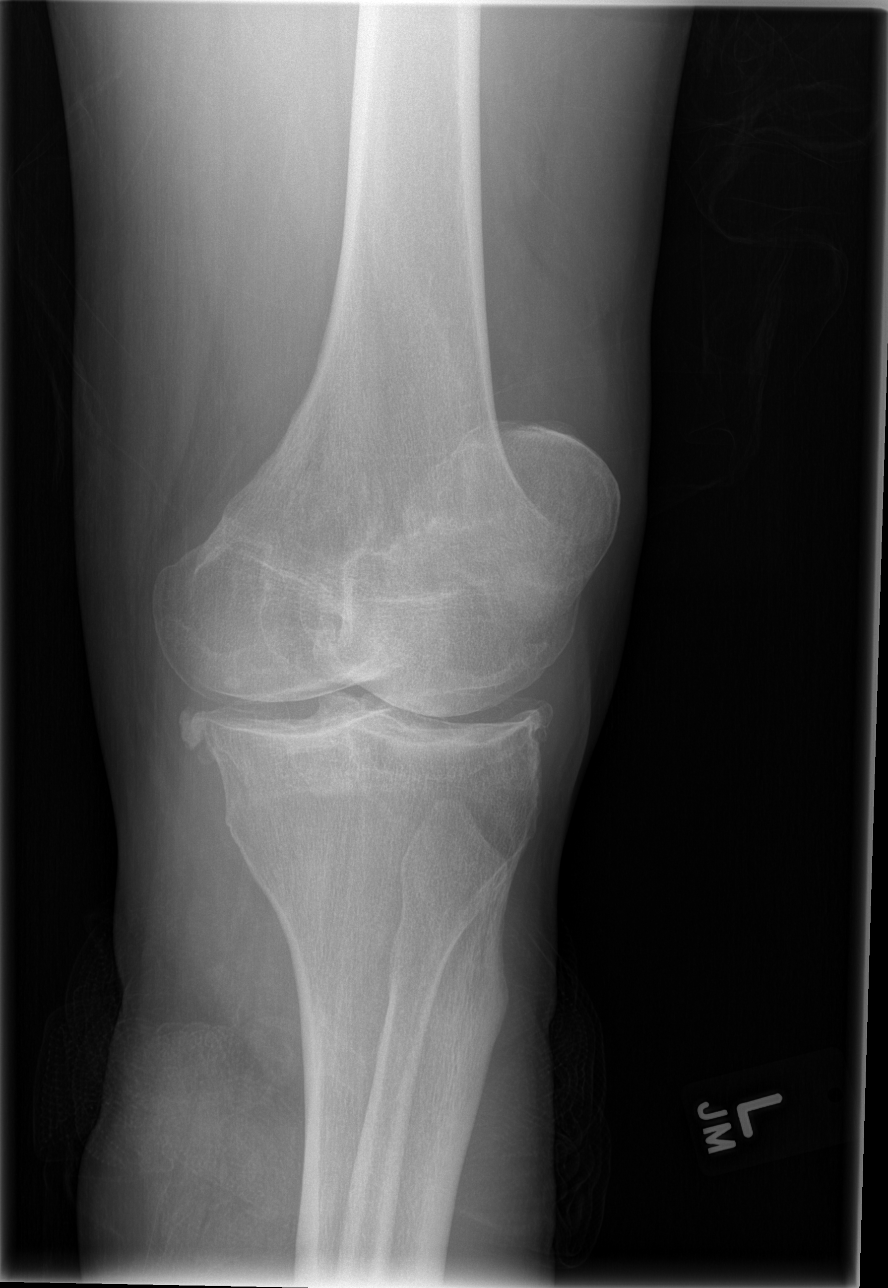

[t knee lat left]
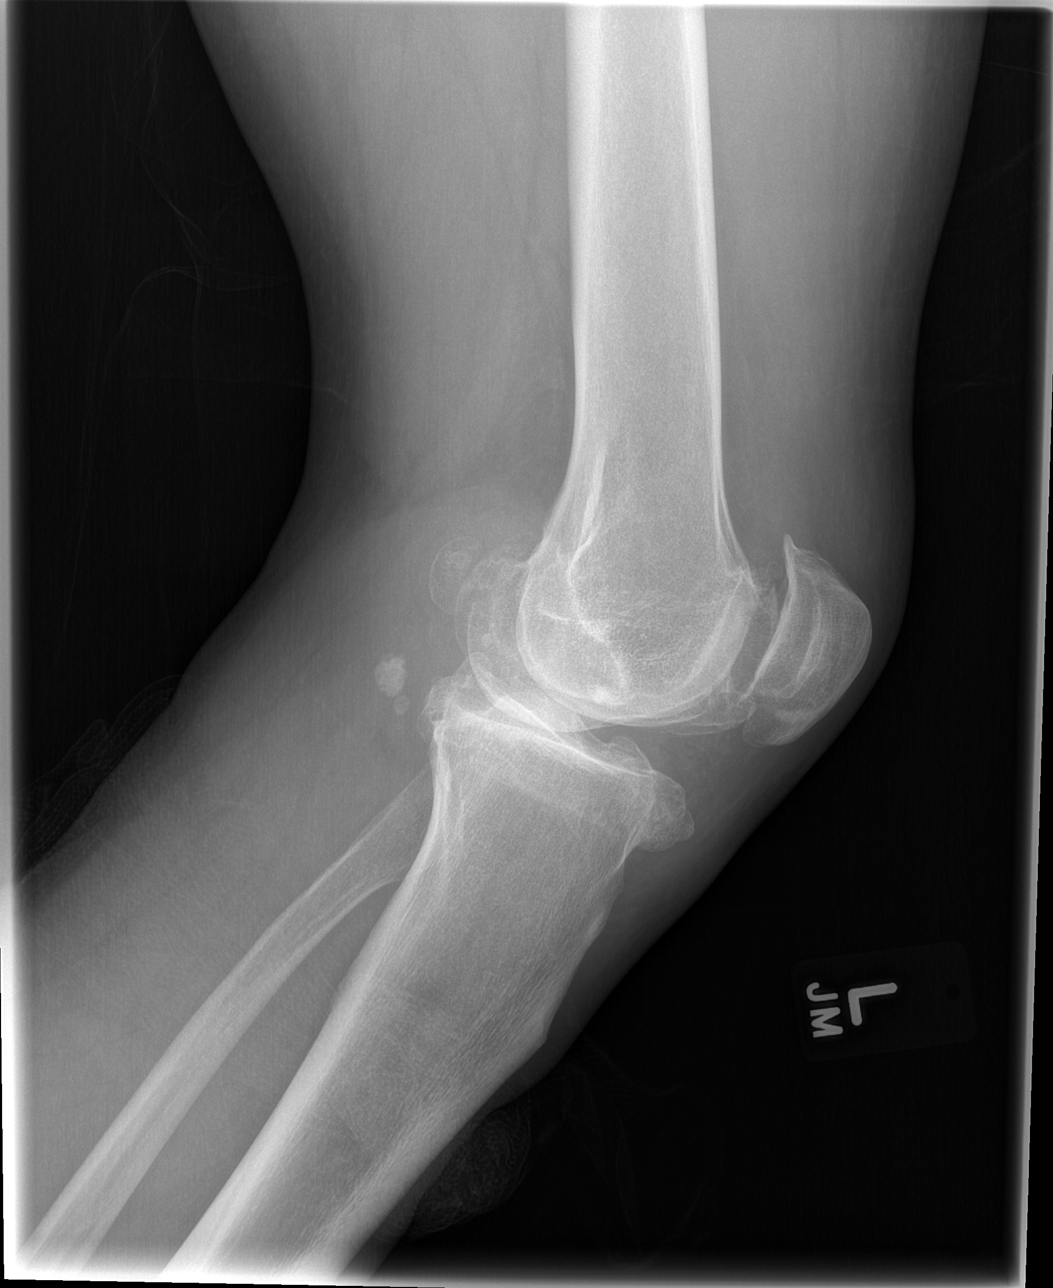

[4 of 4 positions shown; findings below may reference images not displayed]

FINDINGS: Severe tricompartment degenerative change noted. Multiple loose
bodies. No fracture or dislocation knee joint effusion cannot be
excluded.
IMPRESSION: 1. Severe tricompartment degenerative change with multiple loose
bodies. No acute bony abnormality identified.

2.  Knee joint effusion cannot be excluded.

## 2022-01-21 ENCOUNTER — Emergency Department: Admit: 2022-01-21

## 2022-01-21 ENCOUNTER — Inpatient Hospital Stay
Admit: 2022-01-21 | Discharge: 2022-01-22 | Disposition: A | Discharge status: Home / Self Care | Attending: Emergency Medicine

## 2022-01-21 DIAGNOSIS — S90111A Contusion of right great toe without damage to nail, initial encounter: Secondary | ICD-10-CM

## 2022-01-21 NOTE — Discharge Instructions (Signed)
Call your doctor for follow up. Return if worsening. Tylenol and ibuprofen for pain. Ice area.

## 2022-01-21 NOTE — ED Triage Notes (Signed)
Pt ambulatory to triage. Pt c/o right first toe pain and swelling that started today after a piece of metal fell on his right foot. Pt denies taking anything for pain.

## 2022-01-21 NOTE — ED Provider Notes (Signed)
Emergency Department Provider Note       PCP: No primary care provider on file.   Age: 46 y.o.   Sex: male     DISPOSITION Decision To Discharge 01/21/2022 09:20:38 PM       ICD-10-CM    1. Contusion of right great toe without damage to nail, initial encounter  S90.111A           Medical Decision Making     Complexity of Problems Addressed:  1 acute illness    Data Reviewed and Analyzed:  Category 1:   I independently ordered and reviewed each unique test.         Category 2:   I interpreted the X-rays agree with radiology.  No fracture..    Category 3: Discussion of management or test interpretation.  Patient is 46 year old male who presents with pain to the right great toe after dropping something on it at work.  Has bruising and tenderness on exam.  No nail involvement.  X-rays negative for fracture.  Contusion causing pain.  Encouraged Tylenol ibuprofen and ice.  He is to return if worse.      Risk of Complications and/or Morbidity of Patient Management:  Shared medical decision making was utilized in creating the patients health plan today.         History      Jabari Jouett is a 46 y.o. male who presents to the Emergency Department with chief complaint of    Chief Complaint   Patient presents with    Toe Pain      Patient is a 46 year old male who presents with pain to the right great toe.  He dropped something on his foot at work and has bruising with pain.  Worse when you touch the area and move his toe or he walks.  Denies any other injuries or complaints.       Review of Systems   Constitutional: Negative.    HENT: Negative.     Respiratory: Negative.     Cardiovascular: Negative.    Gastrointestinal: Negative.    Musculoskeletal:  Positive for arthralgias.   All other systems reviewed and are negative.    Physical Exam     Vitals signs and nursing note reviewed:  Vitals:    01/21/22 1945   BP: (!) 143/87   Pulse: 64   Resp: 17   Temp: 98.2 F (36.8 C)   TempSrc: Oral   SpO2: 100%   Weight: 150 lb  (68 kg)   Height: 5\' 11"  (1.803 m)      Physical Exam  Vitals and nursing note reviewed.   Constitutional:       General: He is not in acute distress.     Appearance: Normal appearance. He is normal weight. He is not ill-appearing or toxic-appearing.   HENT:      Head: Normocephalic.   Eyes:      Extraocular Movements: Extraocular movements intact.   Cardiovascular:      Rate and Rhythm: Normal rate and regular rhythm.      Pulses: Normal pulses.      Heart sounds: Normal heart sounds.   Pulmonary:      Effort: Pulmonary effort is normal.      Breath sounds: Normal breath sounds.   Musculoskeletal:         General: Tenderness present. No swelling. Normal range of motion.      Cervical back: Normal range of motion and neck supple.  Comments: Small bruise with tenderness to the dorsum of the right foot over the first MTP joint.  Good cap refill.  Sensation intact.  Can move the toe, but painful.   Skin:     General: Skin is warm and dry.      Findings: Bruising present. No rash.   Neurological:      General: No focal deficit present.      Mental Status: He is alert and oriented to person, place, and time. Mental status is at baseline.   Psychiatric:         Mood and Affect: Mood normal.         Behavior: Behavior normal.         Thought Content: Thought content normal.        Procedures     Procedures    Orders Placed This Encounter   Procedures    XR TOE RIGHT (MIN 2 VIEWS)        Medications - No data to display    New Prescriptions    No medications on file        History reviewed. No pertinent past medical history.     History reviewed. No pertinent surgical history.     Social History     Tobacco Use    Smoking status: Every Day     Packs/day: 0.50     Types: Cigarettes    Smokeless tobacco: Never   Substance and Sexual Activity    Alcohol use: Yes    Drug use: Yes     Types: Marijuana Sheran Fava)        Previous Medications    No medications on file        Results for orders placed or performed during the  hospital encounter of 01/21/22   XR TOE RIGHT (MIN 2 VIEWS)    Narrative    3 views right first toe 01/21/2022 8:00 PM    HISTORY: Trauma.     COMPARISON: None.    FINDINGS:     Bones: The right first toe is in anatomic alignment. No fracture or osseous   lesion.    Joints: Joint spaces preserved. No erosion or osteophyte.    Soft tissues: No soft tissue swelling. No foreign body.      Impression    1. Unremarkable study right first toe.     Harrington Challenger, M.D.   01/21/2022 9:05:00 PM        XR TOE RIGHT (MIN 2 VIEWS)   Final Result   1. Unremarkable study right first toe.       Harrington Challenger, M.D.    01/21/2022 9:05:00 PM                        Voice dictation software was used during the making of this note.  This software is not perfect and grammatical and other typographical errors may be present.  This note has not been completely proofread for errors.       Cristela Blue, [place]  01/21/22 2125

## 2022-01-21 NOTE — ED Notes (Signed)
I have reviewed discharge instructions with the patient.  The patient verbalized understanding.    Patient left ED via Discharge Method: ambulatory to Home with self.    Opportunity for questions and clarification provided.       Patient given 0 scripts.         To continue your aftercare when you leave the hospital, you may receive an automated call from our care team to check in on how you are doing.  This is a free service and part of our promise to provide the best care and service to meet your aftercare needs." If you have questions, or wish to unsubscribe from this service please call [phone].  Thank you for Choosing our [facility] Emergency Department.       Reino Kent, RN  01/21/22 2145

## 2022-03-12 ENCOUNTER — Emergency Department: Admit: 2022-03-12

## 2022-03-12 ENCOUNTER — Inpatient Hospital Stay
Admit: 2022-03-12 | Discharge: 2022-03-12 | Disposition: A | Discharge status: Home / Self Care | Attending: Emergency Medicine

## 2022-03-12 DIAGNOSIS — M79672 Pain in left foot: Secondary | ICD-10-CM

## 2022-03-12 NOTE — ED Triage Notes (Signed)
Pt arrives for left foot,toe pain after having something fall on it at work a couple weeks ago. Work stating that he needs xr and worjkj note.

## 2022-03-12 NOTE — ED Provider Notes (Signed)
Emergency Department Provider Note       PCP: No primary care provider on file.   Age: 46 y.o.   Sex: male     DISPOSITION Decision To Discharge 03/12/2022 11:18:24 AM       ICD-10-CM    1. Left foot pain  M79.672           Medical Decision Making     Complexity of Problems Addressed:  1 minor problem    Data Reviewed and Analyzed:  Category 1:   I independently ordered and reviewed each unique test.         Category 2:   I interpreted the X-rays no acute findings.    Category 3: Discussion of management or test interpretation.  46 year old male presenting for work clearance.  Apparently dropped a crate on his left great toe approximately a week ago.  Employer requested patient present for routine x-ray and medical clearance.  Left toe x-ray without osseous abnormality.  Stable for discharge.  Work note provided.      Risk of Complications and/or Morbidity of Patient Management:           History      Chad Molina is a 46 y.o. male who presents to the Emergency Department with chief complaint of    Chief Complaint   Patient presents with    Foot Pain      Patient is a 46 year old male with no significant past medical history presenting to the emergency department requesting routine foot x-ray and clearance to return to work.  Patient states that he dropped a crate on his left great toe approximately a week ago while working.  States that he had some pain at the time which is entirely resolved.  States that he was instructed by his employer to present for x-ray and medical clearance to return to work.  He has no acute complaints today.    The history is provided by the patient.      Review of Systems   Constitutional:  Negative for chills, fatigue and fever.   Eyes:  Negative for visual disturbance.   Respiratory:  Negative for chest tightness, shortness of breath and wheezing.    Cardiovascular:  Negative for chest pain.   Gastrointestinal:  Negative for abdominal pain, diarrhea, nausea and vomiting.    Genitourinary:  Negative for dysuria.   Musculoskeletal:  Negative for arthralgias and myalgias.   Skin:  Negative for color change.   Neurological:  Negative for dizziness, syncope, weakness, light-headedness and headaches.   All other systems reviewed and are negative.    Physical Exam     Vitals signs and nursing note reviewed:  Vitals:    03/12/22 1039   BP: 139/89   Pulse: 64   Resp: 18   Temp: 98.2 F (36.8 C)   TempSrc: Oral   SpO2: 98%   Weight: 160 lb (72.6 kg)   Height: 5\' 11"  (1.803 m)      Physical Exam  Vitals and nursing note reviewed.   Constitutional:       General: He is not in acute distress.     Appearance: Normal appearance. He is not ill-appearing or diaphoretic.   HENT:      Head: Normocephalic and atraumatic.      Right Ear: External ear normal.      Left Ear: External ear normal.      Nose: Nose normal.   Eyes:      General: No scleral icterus.  Right eye: No discharge.         Left eye: No discharge.      Extraocular Movements: Extraocular movements intact.      Conjunctiva/sclera: Conjunctivae normal.   Cardiovascular:      Rate and Rhythm: Normal rate and regular rhythm.      Pulses: Normal pulses.      Heart sounds: Normal heart sounds.   Pulmonary:      Effort: Pulmonary effort is normal.      Breath sounds: Normal breath sounds.   Abdominal:      General: Abdomen is flat.      Palpations: Abdomen is soft.      Tenderness: There is no abdominal tenderness.   Musculoskeletal:         General: Normal range of motion.      Cervical back: Normal range of motion and neck supple.   Skin:     General: Skin is warm and dry.   Neurological:      General: No focal deficit present.      Mental Status: He is alert and oriented to person, place, and time.   Psychiatric:         Mood and Affect: Mood normal.         Behavior: Behavior normal.        Procedures     Procedures    Orders Placed This Encounter   Procedures    XR TOE LEFT (MIN 2 VIEWS)        Medications - No data to  display    New Prescriptions    No medications on file        No past medical history on file.     No past surgical history on file.     Social History     Socioeconomic History    Marital status: Single        Previous Medications    No medications on file        Results for orders placed or performed during the hospital encounter of 03/12/22   XR TOE LEFT (MIN 2 VIEWS)    Narrative    EXAMINATION: XR TOE LEFT (MIN 2 VIEWS) 03/12/2022 11:04 AM    ACCESSION NUMBER: [id number]    COMPARISON: None available    INDICATION: Dropped crate on foot    TECHNIQUE: 3 views of the left great toe were obtained.     FINDINGS:  No acute fracture or dislocation. The joint spaces are maintained. The soft  tissues are unremarkable. Bony mineralization is preserved.          Impression    No acute osseous abnormality.          XR TOE LEFT (MIN 2 VIEWS)   Final Result   No acute osseous abnormality.                           Voice dictation software was used during the making of this note.  This software is not perfect and grammatical and other typographical errors may be present.  This note has not been completely proofread for errors.      Vonzella Nipple, [place]  03/12/22 1122

## 2022-03-12 NOTE — Discharge Instructions (Signed)
Please return with any worsening symptoms or concerns.

## 2022-03-12 NOTE — ED Notes (Signed)
I have reviewed discharge instructions with the patient.  The patient verbalized understanding.    Patient left ED via Discharge Method: ambulatory to Home with self.    Opportunity for questions and clarification provided.       Patient given 0 scripts.         To continue your aftercare when you leave the hospital, you may receive an automated call from our care team to check in on how you are doing.  This is a free service and part of our promise to provide the best care and service to meet your aftercare needs." If you have questions, or wish to unsubscribe from this service please call [phone].  Thank you for Choosing our [facility] Emergency Department.       Alric Quan, RN  03/12/22 1130

## 2022-03-17 ENCOUNTER — Emergency Department: Admit: 2022-03-17

## 2022-03-17 ENCOUNTER — Inpatient Hospital Stay
Admit: 2022-03-17 | Discharge: 2022-03-17 | Disposition: A | Discharge status: Home / Self Care | Attending: Emergency Medicine

## 2022-03-17 DIAGNOSIS — R079 Chest pain, unspecified: Secondary | ICD-10-CM

## 2022-03-17 DIAGNOSIS — R0789 Other chest pain: Secondary | ICD-10-CM

## 2022-03-17 LAB — BASIC METABOLIC PANEL
Anion Gap: 11 mmol/L (ref 2–11)
BUN: 10 MG/DL (ref 6–23)
CO2: 24 mmol/L (ref 21–32)
Calcium: 9.2 MG/DL (ref 8.3–10.4)
Chloride: 108 mmol/L — ABNORMAL HIGH (ref 98–107)
Creatinine: 0.64 MG/DL — ABNORMAL LOW (ref 0.8–1.5)
Est, Glom Filt Rate: >60 mL/min/{1.73_m2} (ref >60)
Glucose: 100 mg/dL (ref 65–100)
Potassium: 3.6 mmol/L (ref 3.5–5.1)
Sodium: 143 mmol/L (ref 133–143)

## 2022-03-17 LAB — CBC WITH AUTO DIFFERENTIAL
Absolute Immature Granulocyte: 0 10*3/uL (ref 0.0–0.5)
Basophils %: 1 % (ref 0.0–2.0)
Basophils Absolute: 0 10*3/uL (ref 0.0–0.2)
Eosinophils %: 4 % (ref 0.5–7.8)
Eosinophils Absolute: 0.1 10*3/uL (ref 0.0–0.8)
Hematocrit: 35.2 % — ABNORMAL LOW (ref 41.1–50.3)
Hemoglobin: 12.3 g/dL — ABNORMAL LOW (ref 13.6–17.2)
Immature Granulocytes: 0 % (ref 0.0–5.0)
Lymphocytes %: 48 % — ABNORMAL HIGH (ref 13–44)
Lymphocytes Absolute: 1.9 10*3/uL (ref 0.5–4.6)
MCH: 31.7 PG (ref 26.1–32.9)
MCHC: 34.9 g/dL (ref 31.4–35.0)
MCV: 90.7 FL (ref 82.0–102.0)
MPV: 8.8 FL — ABNORMAL LOW (ref 9.4–12.3)
Monocytes %: 13 % — ABNORMAL HIGH (ref 4.0–12.0)
Monocytes Absolute: 0.5 10*3/uL (ref 0.1–1.3)
Neutrophils %: 35 % — ABNORMAL LOW (ref 43–78)
Neutrophils Absolute: 1.4 10*3/uL — ABNORMAL LOW (ref 1.7–8.2)
Platelets: 266 10*3/uL (ref 150–450)
RBC: 3.88 M/uL — ABNORMAL LOW (ref 4.23–5.60)
RDW: 12.2 % (ref 11.9–14.6)
WBC: 4 10*3/uL — ABNORMAL LOW (ref 4.3–11.1)
nRBC: 0 10*3/uL (ref 0.0–0.2)

## 2022-03-17 LAB — TROPONIN T
Troponin T: <6 ng/L (ref 0–22)
Troponin T: <6 ng/L (ref 0–22)

## 2022-03-17 LAB — EKG 12-LEAD
Atrial Rate: 58 {beats}/min
P Axis: 57 degrees
P-R Interval: 221 ms
Q-T Interval: 408 ms
QRS Duration: 88 ms
QTc Calculation (Bazett): 401 ms
R Axis: -52 degrees
T Axis: 63 degrees
Ventricular Rate: 58 {beats}/min

## 2022-03-17 NOTE — ED Provider Notes (Signed)
[facility]  Free-standing Emergency Department    DISPOSITION Decision To Discharge 03/17/2022 06:23:22 AM       ICD-10-CM    1. Chest pain, unspecified type  R07.9       2. Syncope and collapse  R55         ED Course     ED Course as of 03/17/22 0624   Sun Mar 17, 2022   3948 46 year old male presents with 1 hour onset of acute left-sided chest pain.  EKG without evidence of STEMI.  He is chest pain-free at this time.  Vitals within normal limits.  PERC negative. Will obtain blood work including serial cardiac enzymes.   [ER]   0412 EKG: Sinus bradycardia, rate 58.  No STEMI.  Left anterior fascicular block.  Nonspecific ST and T wave changes.  Time: 0357 [ER]   0623 Feels improved on reassessment.  Blood work within normal limits including troponin x2.  Ambulated around ER without difficulty.  Stable for discharge home.  Return precautions given. [ER]      ED Course User Index  [ER] Iverson Alamin, MD     Complexity of Problems Addressed:  1 or more acute illnesses that pose a threat to life or bodily function.     Data Reviewed and Analyzed:  Category 1:   I ordered each unique test.  I interpreted the results of each unique test.      Category 2:   ED EKG was independently interpreted in the absence of a cardiologist.  I independently interpreted the cardiac monitor rhythm strip no arrhythmia.  I interpreted the X-rays no acute process.  I interpreted the labs.    Category 3: Discussion of management or test interpretation.  See ED Course above    HPI   Chad Molina is a 46 y.o. male with a history of none who presents to the ED with complaint of chest pain.  Patient reports a 1 hour history of a cute onset of chest pain that awoke him from sleep.  Pain persisted when he went to use the bathroom when he had acute episode of sharp pain states he woke up on the ground.  Unsure if he hit his head.  Does not remember passing out.  Did have some persistent pain which has since  resolved.  States he does not have any pain at this time as will come on intermittently.  Pain is primarily sharp and stabbing located in the left side of his chest.  He had similar symptoms approximately 5 years ago in [place] when he was diagnosed with "large left side of my heart ".  He is not taking medications.  Denies shortness of breath, fever, chills, cough, abdominal pain, nausea, vomiting, diaphoresis, leg swelling, hemoptysis.  No recent surgeries.    History   No past medical history on file.  No past surgical history on file.  No family history on file.  No Known Allergies    Physical Exam     Vitals:    03/17/22 0530 03/17/22 0545 03/17/22 0600 03/17/22 0615   BP: 133/80 (!) 144/80 (!) 145/95 (!) 145/90   Pulse: 54 50 50 52   Resp: 17 15 15 16    Temp:       SpO2: 98% 96% 100% 100%   Weight:       Height:         Nursing note and vitals reviewed.    Constitutional: Well developed,  NAD  HEENT: Atraumatic, conjugate gaze, EOM intact  Neck: Supple  Cardiovascular: No cyanosis, diaphoresis, or JVD appreciated. Normal S1/S2 with no murmurs noted.  Respiratory: Effort normal. No respiratory distress. Lungs CTAB.  Gastrointestinal: Non-distended. No guarding or rebound. Non-tender.  MSK: No deformities appreciated. No peripheral edema.  Skin: Skin is warm and dry. No rash appreciated.  Neuro: Alert and oriented, moves all four extremities.  Psych: Pleasant and cooperative.    Procedures   Procedures    MDM     Labs Reviewed   BASIC METABOLIC PANEL - Abnormal; Notable for the following components:       Result Value    Chloride 108 (*)     Creatinine 0.64 (*)     All other components within normal limits   CBC WITH AUTO DIFFERENTIAL - Abnormal; Notable for the following components:    WBC 4.0 (*)     RBC 3.88 (*)     Hemoglobin 12.3 (*)     Hematocrit 35.2 (*)     MPV 8.8 (*)     Neutrophils % 35 (*)     Lymphocytes % 48 (*)     Monocytes % 13 (*)     Neutrophils Absolute 1.4 (*)     All other components  within normal limits   TROPONIN T   TROPONIN T     Medications - No data to display  XR CHEST (2 VW)   Final Result   No acute cardiopulmonary abnormality.      Thank you for the referral of this patient. This exam was interpreted by an    Biomedical engineer of Radiology certified radiologist with subspecialty training. If    there are any questions regarding this exam please feel free to contact a    radiologist directly at [phone].         Slot: 18       Caprice Red, M.D.    03/17/2022 4:27:00 AM        Voice dictation software was used during the making of this note.  This software is not perfect and grammatical and other typographical errors may be present.  This note has not been completely proofread for errors.     Iverson Alamin, MD  03/17/22 [phone]

## 2022-03-17 NOTE — ED Triage Notes (Signed)
Pt was woke up from sleep with chest pain. He states it went away and he went to use bathroom, while he was urinating, he passed out and woke up on the floor. Rates the pain a 7 on a scale of 0-10 during triage. Describes the pain as sharp in nature on left side of chest.

## 2022-03-17 NOTE — ED Notes (Signed)
I have reviewed discharge instructions with the patient.  The patient verbalized understanding.    Patient left ED via Discharge Method: ambulatory to Home with self.    Opportunity for questions and clarification provided.       Patient given 0 scripts.         To continue your aftercare when you leave the hospital, you may receive an automated call from our care team to check in on how you are doing.  This is a free service and part of our promise to provide the best care and service to meet your aftercare needs." If you have questions, or wish to unsubscribe from this service please call [phone].  Thank you for Choosing our [facility] Emergency Department.        Kaleen Odea, RN  03/17/22 [phone]
